# Patient Record
Sex: Male | Born: 1960 | Race: White | Hispanic: No | Marital: Single | Smoking: Never smoker
Health system: Southern US, Community
[De-identification: ages and names within clinical notes are randomized; demographics above are authoritative.]

## PROBLEM LIST (undated history)

## (undated) DIAGNOSIS — M75100 Unspecified rotator cuff tear or rupture of unspecified shoulder, not specified as traumatic: Secondary | ICD-10-CM

## (undated) DIAGNOSIS — Z789 Other specified health status: Secondary | ICD-10-CM

## (undated) DIAGNOSIS — M19012 Primary osteoarthritis, left shoulder: Secondary | ICD-10-CM

## (undated) DIAGNOSIS — K5792 Diverticulitis of intestine, part unspecified, without perforation or abscess without bleeding: Secondary | ICD-10-CM

## (undated) DIAGNOSIS — M7550 Bursitis of unspecified shoulder: Secondary | ICD-10-CM

## (undated) HISTORY — DX: Bursitis of unspecified shoulder: M75.50

## (undated) HISTORY — DX: Diverticulitis of intestine, part unspecified, without perforation or abscess without bleeding: K57.92

## (undated) HISTORY — DX: Unspecified rotator cuff tear or rupture of unspecified shoulder, not specified as traumatic: M75.100

---

## 2006-08-10 ENCOUNTER — Encounter: Admission: RE | Admit: 2006-08-10 | Discharge: 2006-08-10 | Payer: Self-pay | Admitting: Internal Medicine

## 2006-08-20 ENCOUNTER — Ambulatory Visit: Payer: Self-pay | Admitting: Gastroenterology

## 2006-08-27 ENCOUNTER — Ambulatory Visit (HOSPITAL_COMMUNITY): Admission: RE | Admit: 2006-08-27 | Discharge: 2006-08-27 | Payer: Self-pay | Admitting: Gastroenterology

## 2006-08-30 ENCOUNTER — Ambulatory Visit: Payer: Self-pay | Admitting: Gastroenterology

## 2009-01-19 ENCOUNTER — Ambulatory Visit: Payer: Self-pay | Admitting: Sports Medicine

## 2009-01-19 DIAGNOSIS — M67919 Unspecified disorder of synovium and tendon, unspecified shoulder: Secondary | ICD-10-CM | POA: Insufficient documentation

## 2009-01-19 DIAGNOSIS — M25519 Pain in unspecified shoulder: Secondary | ICD-10-CM

## 2009-01-19 DIAGNOSIS — M719 Bursopathy, unspecified: Secondary | ICD-10-CM

## 2009-01-26 ENCOUNTER — Ambulatory Visit: Payer: Self-pay | Admitting: Sports Medicine

## 2009-01-27 ENCOUNTER — Encounter: Admission: RE | Admit: 2009-01-27 | Discharge: 2009-03-25 | Payer: Self-pay | Admitting: Sports Medicine

## 2009-01-28 ENCOUNTER — Encounter: Payer: Self-pay | Admitting: Sports Medicine

## 2009-03-04 ENCOUNTER — Ambulatory Visit: Payer: Self-pay | Admitting: Sports Medicine

## 2009-03-05 ENCOUNTER — Encounter: Payer: Self-pay | Admitting: Sports Medicine

## 2009-04-26 HISTORY — PX: COLONOSCOPY: SHX174

## 2009-08-24 HISTORY — PX: OTHER SURGICAL HISTORY: SHX169

## 2009-10-04 ENCOUNTER — Ambulatory Visit: Payer: Self-pay | Admitting: Family Medicine

## 2009-10-04 DIAGNOSIS — J309 Allergic rhinitis, unspecified: Secondary | ICD-10-CM | POA: Insufficient documentation

## 2009-10-04 DIAGNOSIS — Z8719 Personal history of other diseases of the digestive system: Secondary | ICD-10-CM

## 2009-10-04 DIAGNOSIS — R1032 Left lower quadrant pain: Secondary | ICD-10-CM

## 2009-10-04 LAB — CONVERTED CEMR LAB
Blood in Urine, dipstick: NEGATIVE
Ketones, urine, test strip: NEGATIVE
Nitrite: NEGATIVE
Protein, U semiquant: NEGATIVE
Urobilinogen, UA: 0.2

## 2009-10-06 LAB — CONVERTED CEMR LAB
Anti Nuclear Antibody(ANA): NEGATIVE
Basophils Absolute: 0 10*3/uL (ref 0.0–0.1)
Basophils Relative: 0.3 % (ref 0.0–3.0)
CO2: 31 meq/L (ref 19–32)
Calcium: 9.3 mg/dL (ref 8.4–10.5)
Eosinophils Relative: 1.3 % (ref 0.0–5.0)
Glucose, Bld: 82 mg/dL (ref 70–99)
HCT: 42.7 % (ref 39.0–52.0)
Hemoglobin: 15 g/dL (ref 13.0–17.0)
Lymphocytes Relative: 27.6 % (ref 12.0–46.0)
MCHC: 35.2 g/dL (ref 30.0–36.0)
MCV: 91.6 fL (ref 78.0–100.0)
Monocytes Absolute: 0.3 10*3/uL (ref 0.1–1.0)
Monocytes Relative: 7.3 % (ref 3.0–12.0)
Neutro Abs: 2.9 10*3/uL (ref 1.4–7.7)
Neutrophils Relative %: 63.5 % (ref 43.0–77.0)
RDW: 13.2 % (ref 11.5–14.6)
Sodium: 143 meq/L (ref 135–145)
Total Protein: 7.5 g/dL (ref 6.0–8.3)
WBC: 4.6 10*3/uL (ref 4.5–10.5)

## 2009-10-26 ENCOUNTER — Ambulatory Visit: Payer: Self-pay | Admitting: Family Medicine

## 2009-10-26 DIAGNOSIS — M2550 Pain in unspecified joint: Secondary | ICD-10-CM | POA: Insufficient documentation

## 2009-10-26 DIAGNOSIS — I10 Essential (primary) hypertension: Secondary | ICD-10-CM | POA: Insufficient documentation

## 2009-11-04 ENCOUNTER — Encounter: Payer: Self-pay | Admitting: Family Medicine

## 2010-05-03 ENCOUNTER — Ambulatory Visit: Payer: Self-pay | Admitting: Family Medicine

## 2010-05-03 DIAGNOSIS — J209 Acute bronchitis, unspecified: Secondary | ICD-10-CM

## 2010-07-26 NOTE — Consult Note (Signed)
Summary: [facility]   Imported By: Maryln Gottron 11/23/2009 14:22:18  _____________________________________________________________________  External Attachment:    Type:   Image     Comment:   External Document

## 2010-07-26 NOTE — Assessment & Plan Note (Signed)
Summary: fup meds//ccm   Vital Signs:  Patient profile:   50 year old male Weight:      240 pounds BMI:     29.32 BP sitting:   140 / 102  (left arm) Cuff size:   regular  Vitals Entered By: Raechel Ache, RN (Oct 26, 2009 2:10 PM) CC: ROV- still having headaches, joint pain and feels light-headed.   History of Present Illness: Here to follow up symptoms including, diffuse joint pains and body aches, sweats, HAs, and LLQ abdominal which led him to see [place] 2 weeks ago. We gave him a course of Cipro and did a lab workup that was mostly unremarkable. This included a CBC, BMET, liver panel, TSH, ESR, ANA, and Lyme antibody titers. His RF was slightly elevated at 28. Since then the abdomenal pain has resolved, and the HAs have improved from throbbing to mild and dull intermittent pains. He still has the joint pains and generalized fatigue. No skin rashes. We have also been watching his elevated BP which is still up today.   Allergies (verified): No Known Drug Allergies  Past History:  Past Medical History: Reviewed history from 10/04/2009 and no changes required. Allergic rhinitis Diverticulitis, hx of right shoulder bursitis and rotator cuff syndrome, sees Dr. Enid Baas  Past Surgical History: Reviewed history from 10/04/2009 and no changes required. large sebaceous cyst removed from left cheek in 08-2009 per Dr. Aurelio Jew  Family History: Reviewed history from 10/04/2009 and no changes required. Family History Breast cancer 1st degree relative <50 Family History Hypertension Paget's disease- Dad  Review of Systems  The patient denies anorexia, fever, weight loss, weight gain, vision loss, decreased hearing, hoarseness, chest pain, syncope, dyspnea on exertion, peripheral edema, prolonged cough, hemoptysis, abdominal pain, melena, hematochezia, severe indigestion/heartburn, hematuria, incontinence, genital sores, muscle weakness, suspicious skin lesions, transient blindness,  difficulty walking, depression, unusual weight change, abnormal bleeding, enlarged lymph nodes, angioedema, breast masses, and testicular masses.    Physical Exam  General:  Well-developed,well-nourished,in no acute distress; alert,appropriate and cooperative throughout examination Head:  Normocephalic and atraumatic without obvious abnormalities. No apparent alopecia or balding. Eyes:  No corneal or conjunctival inflammation noted. EOMI. Perrla. Funduscopic exam benign, without hemorrhages, exudates or papilledema. Vision grossly normal. Ears:  External ear exam shows no significant lesions or deformities.  Otoscopic examination reveals clear canals, tympanic membranes are intact bilaterally without bulging, retraction, inflammation or discharge. Hearing is grossly normal bilaterally. Nose:  External nasal examination shows no deformity or inflammation. Nasal mucosa are pink and moist without lesions or exudates. Mouth:  Oral mucosa and oropharynx without lesions or exudates.  Teeth in good repair. Neck:  No deformities, masses, or tenderness noted. Lungs:  Normal respiratory effort, chest expands symmetrically. Lungs are clear to auscultation, no crackles or wheezes. Heart:  Normal rate and regular rhythm. S1 and S2 normal without gallop, murmur, click, rub or other extra sounds. Abdomen:  Bowel sounds positive,abdomen soft and non-tender without masses, organomegaly or hernias noted. Msk:  No deformity or scoliosis noted of thoracic or lumbar spine.  no joint tenderness, no joint swelling, and no joint warmth.   Extremities:  no edema Neurologic:  alert & oriented X3, cranial nerves II-XII intact, and gait normal.   Skin:  Intact without suspicious lesions or rashes   Impression & Recommendations:  Problem # 1:  ARTHRALGIA (ICD-719.40)  Orders: Rheumatology Referral (Rheumatology)  Problem # 2:  ESSENTIAL HYPERTENSION (ICD-401.9)  His updated medication list for this problem  includes:    Hydrochlorothiazide 25 Mg Tabs (Hydrochlorothiazide) ..... Once daily  Complete Medication List: 1)  Hydrocodone-acetaminophen 5-500 Mg Tabs (Hydrocodone-acetaminophen) .Marland Kitchen.. 1 by mouth qid prin 2)  Hydrochlorothiazide 25 Mg Tabs (Hydrochlorothiazide) .... Once daily  Patient Instructions: 1)  I am not sure how to explain all his symptoms, although now they seem to be more of a rheumatologic nature than infectious. we will refer him to Rheumatology to help [place] evaluate this. Start treating the HTN with some HCTZ.  Prescriptions: HYDROCHLOROTHIAZIDE 25 MG TABS (HYDROCHLOROTHIAZIDE) once daily  #30 x 5   Entered and Authorized by:   Nelwyn Salisbury MD   Signed by:   Nelwyn Salisbury MD on 10/26/2009   Method used:   Electronically to        [facility]  [phone]* (retail)       [address] [place], [place]  [address]       Ph: [phone] or [phone]       Fax: [phone]   RxID:   [id number]

## 2010-07-26 NOTE — Assessment & Plan Note (Signed)
Summary: SINUSITIS? // RS   Vital Signs:  Patient profile:   50 year old male Weight:      250 pounds O2 Sat:      94 % Temp:     98.6 degrees F Pulse rate:   92 / minute BP sitting:   120 / 80  (left arm) Cuff size:   large  Vitals Entered By: Pura Spice, RN (May 03, 2010 1:36 PM) CC: sinus inf .   History of Present Illness: Here for one week of sinus pressure, HA, ST, chest congestion, and coughing up green sputum. No fever. he is still having intermittent body aches and low grade fevers. We tested for Lyme antibodies along with a number of other things, all of which were negative. He asks about a more accurate test to look for this, such as a Western blot.   Allergies (verified): No Known Drug Allergies  Past History:  Past Medical History: Reviewed history from 10/04/2009 and no changes required. Allergic rhinitis Diverticulitis, hx of right shoulder bursitis and rotator cuff syndrome, sees Dr. Enid Baas  Past Surgical History: Reviewed history from 10/04/2009 and no changes required. large sebaceous cyst removed from left cheek in 08-2009 per Dr. Aurelio Jew  Review of Systems  The patient denies anorexia, weight loss, weight gain, vision loss, decreased hearing, hoarseness, chest pain, syncope, dyspnea on exertion, peripheral edema, hemoptysis, abdominal pain, melena, hematochezia, severe indigestion/heartburn, hematuria, incontinence, genital sores, muscle weakness, suspicious skin lesions, transient blindness, difficulty walking, depression, unusual weight change, abnormal bleeding, enlarged lymph nodes, angioedema, breast masses, and testicular masses.    Physical Exam  General:  Well-developed,well-nourished,in no acute distress; alert,appropriate and cooperative throughout examination Head:  Normocephalic and atraumatic without obvious abnormalities. No apparent alopecia or balding. Eyes:  No corneal or conjunctival inflammation noted. EOMI. Perrla.  Funduscopic exam benign, without hemorrhages, exudates or papilledema. Vision grossly normal. Ears:  External ear exam shows no significant lesions or deformities.  Otoscopic examination reveals clear canals, tympanic membranes are intact bilaterally without bulging, retraction, inflammation or discharge. Hearing is grossly normal bilaterally. Nose:  External nasal examination shows no deformity or inflammation. Nasal mucosa are pink and moist without lesions or exudates. Mouth:  Oral mucosa and oropharynx without lesions or exudates.  Teeth in good repair. Neck:  No deformities, masses, or tenderness noted. Lungs:  scattered rhonchi   Impression & Recommendations:  Problem # 1:  ACUTE BRONCHITIS (ICD-466.0)  His updated medication list for this problem includes:    Augmentin 875-125 Mg Tabs (Amoxicillin-pot clavulanate) .Marland Kitchen..Marland Kitchen Two times a day  Problem # 2:  ARTHRALGIA (ICD-719.40)  Orders: T- * Misc. Laboratory test [phone])  Complete Medication List: 1)  Augmentin 875-125 Mg Tabs (Amoxicillin-pot clavulanate) .... Two times a day  Patient Instructions: 1)  Please schedule a follow-up appointment as needed . we will check a Lyme PCR today.  Prescriptions: AUGMENTIN 875-125 MG TABS (AMOXICILLIN-POT CLAVULANATE) two times a day  #20 x 0   Entered and Authorized by:   Nelwyn Salisbury MD   Signed by:   Nelwyn Salisbury MD on 05/03/2010   Method used:   Electronically to        [facility]  [phone]* (retail)       [address] [place], [place]  [address]       Ph: [phone] or [phone]       Fax: [phone]   RxID:   [id number]  Orders Added: 1)  T- * Misc. Laboratory test [99999] 2)  Est. Patient Level IV [[address]]

## 2010-07-26 NOTE — Assessment & Plan Note (Signed)
Summary: BRAND NEW PT/TO EST/HAVING ISSUES W/ MIGRAINES AND JOINT ISSU...   Vital Signs:  Patient profile:   50 year old male Weight:      243 pounds BMI:     29.69 Temp:     98.0 degrees F oral BP sitting:   158 / 110  (left arm) Cuff size:   regular  Vitals Entered By: Raechel Ache, RN (October 04, 2009 11:12 AM) CC: Went to Urgent Care in Feb with "migraine" and swollen L face. Saw plastic surgeon to have cyst removed from L cheek. Still c/o headaches and night sweats and joints hurt. Here for follow-up.   History of Present Illness: 50 yr old male to establish with [place] who is complaining of 2 weeks of various symptoms including LLQ abdominal pains. These are well localized, sharp, and intermittent. Not very severe. Not related to eating. His BMs are regular, and his urinations are regular. No nausea, appetite is normal. Advil helps a little. He had a bout of diverticulitis in 2008, and this reminds him of what that felt like. He had a colonoscopy in 2008, but I do not have this report available. Along with this he has had diffuse joint aches including shoulders, back, hips, and knees. No swelling or rashes are visible. He has not had a fever, but he describes night sweats on most nights. No cough or SOB. He also has mild generalized HAs at times. He has been told his BP was "borderline high" in the past, but he has never been treated for this.   Preventive Screening-Counseling & Management  Alcohol-Tobacco     Smoking Status: never  Caffeine-Diet-Exercise     Does Patient Exercise: no      Drug Use:  no.    Allergies (verified): No Known Drug Allergies  Past History:  Past Medical History: Allergic rhinitis Diverticulitis, hx of right shoulder bursitis and rotator cuff syndrome, sees Dr. Enid Baas  Past Surgical History: large sebaceous cyst removed from left cheek in 08-2009 per Dr. Aurelio Jew  Family History: Reviewed history and no changes required. Family History  Breast cancer 1st degree relative <50 Family History Hypertension Paget's disease- Dad  Social History: Reviewed history and no changes required. Occupation:  business Scientist, physiological Married Never Smoked Alcohol use-yes Drug use-no Regular exercise-no Occupation:  employed Smoking Status:  never Drug Use:  no Does Patient Exercise:  no  Review of Systems  The patient denies anorexia, fever, weight loss, weight gain, vision loss, decreased hearing, hoarseness, chest pain, syncope, dyspnea on exertion, peripheral edema, prolonged cough, headaches, hemoptysis, melena, hematochezia, severe indigestion/heartburn, hematuria, incontinence, genital sores, muscle weakness, suspicious skin lesions, transient blindness, difficulty walking, depression, unusual weight change, abnormal bleeding, enlarged lymph nodes, angioedema, breast masses, and testicular masses.    Physical Exam  General:  Well-developed,well-nourished,in no acute distress; alert,appropriate and cooperative throughout examination Head:  Normocephalic and atraumatic without obvious abnormalities. No apparent alopecia or balding. Eyes:  No corneal or conjunctival inflammation noted. EOMI. Perrla. Funduscopic exam benign, without hemorrhages, exudates or papilledema. Vision grossly normal. Ears:  External ear exam shows no significant lesions or deformities.  Otoscopic examination reveals clear canals, tympanic membranes are intact bilaterally without bulging, retraction, inflammation or discharge. Hearing is grossly normal bilaterally. Nose:  External nasal examination shows no deformity or inflammation. Nasal mucosa are pink and moist without lesions or exudates. Mouth:  Oral mucosa and oropharynx without lesions or exudates.  Teeth in good repair. Neck:  No deformities, masses, or  tenderness noted. Lungs:  Normal respiratory effort, chest expands symmetrically. Lungs are clear to auscultation, no crackles or wheezes. Heart:   Normal rate and regular rhythm. S1 and S2 normal without gallop, murmur, click, rub or other extra sounds. Abdomen:  soft, normal bowel sounds, no distention, no masses, no guarding, no rigidity, no rebound tenderness, no abdominal hernia, no inguinal hernia, no hepatomegaly, and no splenomegaly.  Mildly tender in the LLQ.  Msk:  No deformity or scoliosis noted of thoracic or lumbar spine.   Extremities:  no edema. No joint tenderness Skin:  Intact without suspicious lesions or rashes Cervical Nodes:  No lymphadenopathy noted Axillary Nodes:  No palpable lymphadenopathy Inguinal Nodes:  No significant adenopathy   Impression & Recommendations:  Problem # 1:  ABDOMINAL PAIN, LEFT LOWER QUADRANT (ICD-789.04)  Orders: Venipuncture ([address]38) TLB-BMP (Basic Metabolic Panel-BMET) (80048-METABOL) TLB-CBC Platelet - w/Differential (85025-CBCD) TLB-Hepatic/Liver Function Pnl (80076-HEPATIC) TLB-TSH (Thyroid Stimulating Hormone) (84443-TSH) TLB-Sedimentation Rate (ESR) (85652-ESR) TLB-Rheumatoid Factor (RA) (75643-PI) T-Antinuclear Antib (ANA) (95188-41660) T-Lyme Disease (63016-01093) UA Dipstick w/o Micro (manual) (23557)  Complete Medication List: 1)  Hydrocodone-acetaminophen 5-500 Mg Tabs (Hydrocodone-acetaminophen) .Marland Kitchen.. 1 by mouth qid prin 2)  Ciprofloxacin Hcl 500 Mg Tabs (Ciprofloxacin hcl) .... Two times a day  Patient Instructions: 1)  His abdominal symptoms are most consistent with diverticulitis, so we will begin treating with Cipro. His other symptoms could be coming from this also, but I wonder if his HAs are related to high BP. Get labs today. Drink fluids, use Advil as needed . We will follow his BP closely.  Prescriptions: CIPROFLOXACIN HCL 500 MG TABS (CIPROFLOXACIN HCL) two times a day  #20 x 0   Entered and Authorized by:   Nelwyn Salisbury MD   Signed by:   Nelwyn Salisbury MD on 10/04/2009   Method used:   Elect[facility] to        [facility]  [phone]* (retail)        [address] [place], [place]  [address]       Ph: [phone] or [phone]       Fax: [phone]   RxID:   [id number]    Preventive Care Screening  Colonoscopy:    Date:  04/26/2009    Results:  normal       Laboratory Results   Urine Tests    Routine Urinalysis   Color: yellow Appearance: Clear Glucose: negative   (Normal Range: Negative) Bilirubin: negative   (Normal Range: Negative) Ketone: negative   (Normal Range: Negative) Spec. Gravity: 1.010   (Normal Range: 1.003-1.035) Blood: negative   (Normal Range: Negative) pH: 5.0   (Normal Range: 5.0-8.0) Protein: negative   (Normal Range: Negative) Urobilinogen: 0.2   (Normal Range: 0-1) Nitrite: negative   (Normal Range: Negative) Leukocyte Esterace: negative   (Normal Range: Negative)

## 2010-11-05 ENCOUNTER — Emergency Department (HOSPITAL_COMMUNITY): Payer: BC Managed Care – PPO

## 2010-11-05 ENCOUNTER — Emergency Department (HOSPITAL_COMMUNITY)
Admission: EM | Admit: 2010-11-05 | Discharge: 2010-11-05 | Disposition: A | Discharge status: Home / Self Care | Payer: BC Managed Care – PPO | Attending: Emergency Medicine | Admitting: Emergency Medicine

## 2010-11-05 DIAGNOSIS — M2550 Pain in unspecified joint: Secondary | ICD-10-CM | POA: Insufficient documentation

## 2010-11-05 DIAGNOSIS — G43909 Migraine, unspecified, not intractable, without status migrainosus: Secondary | ICD-10-CM | POA: Insufficient documentation

## 2010-11-05 DIAGNOSIS — IMO0001 Reserved for inherently not codable concepts without codable children: Secondary | ICD-10-CM | POA: Insufficient documentation

## 2010-11-05 DIAGNOSIS — R112 Nausea with vomiting, unspecified: Secondary | ICD-10-CM | POA: Insufficient documentation

## 2010-11-05 DIAGNOSIS — M542 Cervicalgia: Secondary | ICD-10-CM | POA: Insufficient documentation

## 2010-11-05 DIAGNOSIS — H53149 Visual discomfort, unspecified: Secondary | ICD-10-CM | POA: Insufficient documentation

## 2010-11-05 LAB — DIFFERENTIAL
Basophils Relative: 0 % (ref 0–1)
Lymphocytes Relative: 19 % (ref 12–46)
Lymphs Abs: 0.5 10*3/uL — ABNORMAL LOW (ref 0.7–4.0)
Monocytes Relative: 9 % (ref 3–12)
Neutrophils Relative %: 72 % (ref 43–77)

## 2010-11-05 LAB — CBC
MCH: 31.3 pg (ref 26.0–34.0)
MCV: 86.4 fL (ref 78.0–100.0)
Platelets: 157 10*3/uL (ref 150–400)
RBC: 5.08 MIL/uL (ref 4.22–5.81)

## 2010-11-05 LAB — COMPREHENSIVE METABOLIC PANEL
ALT: 40 U/L (ref 0–53)
AST: 40 U/L — ABNORMAL HIGH (ref 0–37)
CO2: 29 mEq/L (ref 19–32)
Calcium: 9.4 mg/dL (ref 8.4–10.5)
Chloride: 103 mEq/L (ref 96–112)
Creatinine, Ser: 0.97 mg/dL (ref 0.4–1.5)
Glucose, Bld: 113 mg/dL — ABNORMAL HIGH (ref 70–99)
Potassium: 4.1 mEq/L (ref 3.5–5.1)

## 2010-11-05 LAB — URINALYSIS, ROUTINE W REFLEX MICROSCOPIC
Bilirubin Urine: NEGATIVE
Glucose, UA: NEGATIVE mg/dL
Ketones, ur: NEGATIVE mg/dL
Leukocytes, UA: NEGATIVE
Protein, ur: NEGATIVE mg/dL
Specific Gravity, Urine: 1.025 (ref 1.005–1.030)
Urobilinogen, UA: 1 mg/dL (ref 0.0–1.0)

## 2010-11-05 LAB — SEDIMENTATION RATE: Sed Rate: 4 mm/hr (ref 0–16)

## 2010-11-07 ENCOUNTER — Telehealth: Payer: Self-pay | Admitting: *Deleted

## 2010-11-07 NOTE — Telephone Encounter (Signed)
Pt in ER Saturday night, and needs return appt.  Appt given

## 2010-11-11 ENCOUNTER — Ambulatory Visit (INDEPENDENT_AMBULATORY_CARE_PROVIDER_SITE_OTHER): Payer: BC Managed Care – PPO | Admitting: Family Medicine

## 2010-11-11 ENCOUNTER — Encounter: Payer: Self-pay | Admitting: Family Medicine

## 2010-11-11 VITALS — BP 112/84 | HR 78 | Temp 98.5°F | Wt 243.0 lb

## 2010-11-11 DIAGNOSIS — M2550 Pain in unspecified joint: Secondary | ICD-10-CM

## 2010-11-11 DIAGNOSIS — R51 Headache: Secondary | ICD-10-CM

## 2010-11-11 DIAGNOSIS — R61 Generalized hyperhidrosis: Secondary | ICD-10-CM

## 2010-11-11 LAB — T4, FREE: Free T4: 1.09 ng/dL (ref 0.60–1.60)

## 2010-11-11 LAB — TSH: TSH: 1.69 u[IU]/mL (ref 0.35–5.50)

## 2010-11-11 LAB — VITAMIN B12: Vitamin B-12: 756 pg/mL (ref 211–911)

## 2010-11-11 NOTE — Letter (Signed)
August 20, 2006     RE:  STEPHENS, SHREVE  MRN:  [id number]  /  DOB:  01-16-1961   Dear Mr. Hilligoss:   It is my pleasure to have treated you recently as a new patient in my  office.  I appreciate your confidence and the opportunity to participate  in your care.   Since I do have a busy inpatient endoscopy schedule and office schedule,  my office hours vary weekly.  I am, however, available for emergency  calls every day through my office.  If I cannot promptly meet an urgent  office appointment, another one of our gastroenterologists will be able  to assist you.   My well-trained staff are prepared to help you at all times.  For  emergencies after office hours, a physician from our gastroenterology  section is always available through my 24-hour answering service.   While you are under my care, I encourage discussion of your questions  and concerns, and I will be happy to return your calls as soon as I am  available.   Once again, I welcome you as a new patient and I look forward to a happy  and healthy relationship.    Sincerely,      Barbette Hair. Arlyce Dice, MD,FACG  Electronically Signed    RDK/MedQ  DD: 08/20/2006  DT: 08/20/2006  Job #: [id number]

## 2010-11-11 NOTE — Letter (Signed)
August 20, 2006    Kari Baars, M.D.  [address]  [place], [place] [address]   RE:  HERLEY, BERNARDINI  MRN:  [id number]  /  DOB:  08-14-1960   Dear Dr. Clelia Croft:   Upon your kind referral, I had the pleasure of evaluating your patient  and I am pleased to offer my findings.  I saw Mr.Mike Watson in the  office today.  Enclosed is a copy of my progress note that details my  findings and recommendations.   Thank you for the opportunity to participate in your patient's care.    Sincerely,      Barbette Hair. Arlyce Dice, MD,FACG  Electronically Signed    RDK/MedQ  DD: 08/20/2006  DT: 08/20/2006  Job #: [id number]

## 2010-11-11 NOTE — Assessment & Plan Note (Signed)
[facility]                         GASTROENTEROLOGY OFFICE NOTE   Mike, Watson                         MRN:          [id number]  DATE:08/20/2006                            DOB:          Oct 28, 1960    REASON FOR CONSULTATION:  Abdominal pain.   [name] is a pleasant 50 year old white male referred through  the courtesy of Dr. Clelia Croft for evaluation.  Over the last 4 months, he has  noted progressive left lower quadrant pain.  It is described as a sharp  pain followed by aching.  His symptoms have been progressive.  He has  also noted a change in bowel habits consisting of up to 7-8 loose stools  a day.  There is no history of melena or hematochezia.  Recent CBC and  CT scan were unremarkable.  He underwent a screening colonoscopy in 2002  that apparently was normal.  Recent hemoccult was also negative.  Past  medical history is pertinent for hypertension.  Family history is  noncontributory.   MEDICATIONS:  He is on no regular medications.  He has no allergies.   He neither smokes nor drinks.  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  Review of systems is positive for urinary frequency with some urgency  increasing nocturia and back pain.   PHYSICAL EXAMINATION:  GENERAL:  He is a healthy-appearing male.  VITAL SIGNS:  Pulse 84, blood pressure 112/84, weight 246.  HEENT: EOMI. PERRLA. Sclerae are anicteric.  Conjunctivae are pink.  NECK:  Supple without thyromegaly, adenopathy or carotid bruits.  CHEST:  Clear to auscultation and percussion without adventitious  sounds.  CARDIAC:  Regular rhythm; normal S1 S2.  There are no murmurs, gallops  or rubs.  ABDOMEN:  He has minimal left lower quadrant tenderness to deep  palpation without guarding or rebound.  Bowel sounds are normoactive.  Abdomen is soft, There are no abdominal masses, organomegaly, splenic  enlargement or hepatomegaly.  EXTREMITIES:  Full range of motion.  No cyanosis,  clubbing or edema.  RECTAL:  Deferred.   IMPRESSION:  1. Progressive left lower quadrant pain with change in bowel habits.      A structure lesion in the colon is of concern increasing      diverticulosis and neoplasm.  2. Polyuria.  I suspect he has enlarged prostate versus bladder      compression from perhaps a colonic lesion.   RECOMMENDATION:  Colonoscopy.  Urologic workup per Dr. Clelia Croft.     Barbette Hair. Arlyce Dice, MD,FACG  Electronically Signed    RDK/MedQ  DD: 08/20/2006  DT: 08/20/2006  Job #: [id number]   cc:   Kari Baars, M.D.

## 2010-11-11 NOTE — Progress Notes (Signed)
  Subjective:    Patient ID: Mike Watson, male    DOB: December 10, 1960, 50 y.o.   MRN: [id number]  HPI Here to follow up on an unusual set of symptoms that started about 10 days ago. These symptoms are almost identical to those he had about one year ago. We worked these up at that time with no etiology ever being found. We did a CT of his head, along with many labs that were all normal. We even checked a Western Blot for Lyme disease, which was negative. He saw Dr. Azzie Roup for a rheumatologic workup, including a UPEP and SPEP, and again no etilogy was found. After several months these symptoms went away, and he did well for quite a while until recently. Now he again is having daily episodes of severe bilateral frontal HAs with sweats and diffuse joint pains. No swelling or rashes. No vision changes. No nausea or vomiting. No measurable fevers. No neck pains. These spells usually come at night, and he has to change his bedsheets once or twice a night due to the sweats. He takes Motrin for the HAs and joint pains, with little relief. He went to the ER on 11-05-10 and had a full workup with labs and another head CT, all of which was normal. He was told to follow up with [place]. His appetite is normal, although he has lost about 6 lbs in the past few months.    Review of Systems  Constitutional: Positive for diaphoresis and unexpected weight change. Negative for fever, chills, activity change, appetite change and fatigue.  Eyes: Negative.   Respiratory: Negative.   Cardiovascular: Negative.   Gastrointestinal: Negative.   Genitourinary: Negative.   Musculoskeletal: Positive for arthralgias. Negative for myalgias, back pain and joint swelling.  Skin: Negative.   Neurological: Positive for headaches. Negative for dizziness, tremors, seizures, syncope, facial asymmetry, speech difficulty, weakness, light-headedness and numbness.  Hematological: Negative.   Psychiatric/Behavioral: Negative.          Objective:   Physical Exam  Constitutional: He is oriented to person, place, and time. He appears well-developed and well-nourished.  HENT:  Head: Normocephalic and atraumatic.  Eyes: Conjunctivae and EOM are normal. Pupils are equal, round, and reactive to light.  Neck: Normal range of motion. Neck supple. No thyromegaly present.  Cardiovascular: Normal rate, regular rhythm, normal heart sounds and intact distal pulses.   Pulmonary/Chest: Effort normal and breath sounds normal.  Abdominal: Soft. Bowel sounds are normal. He exhibits no distension and no mass. There is no tenderness. There is no rebound and no guarding.  Musculoskeletal: Normal range of motion. He exhibits no edema and no tenderness.  Lymphadenopathy:    He has no cervical adenopathy.  Neurological: He is oriented to person, place, and time. He has normal reflexes. No cranial nerve deficit. He exhibits normal muscle tone. Coordination normal.  Skin: Skin is warm and dry. No rash noted. No erythema.          Assessment & Plan:  Headaches and sweats and arthralgias of unknown etiology. These could represent a form of migraines, I suppose, but this would be very unusual. I do not think he has any sort of infection. We will check a full thyroid panel today and check urine metanephrines. Will refer to Neurology also.

## 2010-11-14 ENCOUNTER — Telehealth: Payer: Self-pay

## 2010-11-14 ENCOUNTER — Telehealth: Payer: Self-pay | Admitting: Family Medicine

## 2010-11-14 MED ORDER — OXYCODONE-ACETAMINOPHEN 10-325 MG PO TABS: 1.0000 | ORAL_TABLET | Freq: Four times a day (QID) | ORAL | Status: AC | PRN

## 2010-11-14 NOTE — Progress Notes (Signed)
Quick Note:  Pt is aware. ______ 

## 2010-11-14 NOTE — Telephone Encounter (Signed)
No it is nothing to worry about, but if it drops below 350 I usually suggested getting on a replacement medication. Lets check it again in one year

## 2010-11-14 NOTE — Telephone Encounter (Signed)
Pt stated he would like to know that since his Testerone was on the lower end of the range is that something he needs to be concerned about?

## 2010-11-14 NOTE — Progress Notes (Signed)
Quick Note:  Pt is awre ______

## 2010-11-14 NOTE — Telephone Encounter (Signed)
done

## 2010-11-14 NOTE — Telephone Encounter (Signed)
Pt is aware.  

## 2010-11-17 ENCOUNTER — Telehealth: Payer: Self-pay | Admitting: Family Medicine

## 2010-11-17 NOTE — Telephone Encounter (Signed)
Pt would like blood work results °

## 2010-11-18 ENCOUNTER — Telehealth: Payer: Self-pay | Admitting: Family Medicine

## 2010-11-18 NOTE — Telephone Encounter (Signed)
LMTCB

## 2010-11-18 NOTE — Telephone Encounter (Signed)
Pt calling again with test results of 24 hour urine that was dropped off on Monday

## 2010-11-18 NOTE — Telephone Encounter (Signed)
See report

## 2010-11-18 NOTE — Telephone Encounter (Signed)
Please call him. I dont understand what he needs. Mike Watson talked to him 3 days ago about the results

## 2010-11-18 NOTE — Telephone Encounter (Signed)
Requesting lab results

## 2011-02-10 ENCOUNTER — Telehealth: Payer: Self-pay | Admitting: Family Medicine

## 2011-02-10 DIAGNOSIS — M2550 Pain in unspecified joint: Secondary | ICD-10-CM

## 2011-02-10 NOTE — Telephone Encounter (Signed)
I ordered this referral. Mike Watson will be calling with appt info

## 2011-02-10 NOTE — Telephone Encounter (Signed)
Pt called and is needing a referral to [facility] at [facility] # [phone] and their fax # [phone]. Pts joint pain and headaches have worsened over the last yr. Pt req the first appt avail at referral.

## 2011-02-13 NOTE — Telephone Encounter (Signed)
Spoke with pt and he has his appointment for the referral.

## 2011-06-02 ENCOUNTER — Telehealth: Payer: Self-pay

## 2011-06-02 NOTE — Telephone Encounter (Signed)
Pt called and states he was referred to [facility] and our office should have been contacted for 2 test [facility] would like him to get through Dr. Clent Ridges.  Pt would like to know if the fax was received.

## 2011-06-05 NOTE — Telephone Encounter (Signed)
Pt called again to check of status of orders requested via fax from [facility]. Please return call to pt on his cell

## 2011-06-06 NOTE — Telephone Encounter (Signed)
Left a message for pt to return call 

## 2011-06-06 NOTE — Telephone Encounter (Signed)
Pt is aware.  

## 2011-06-06 NOTE — Telephone Encounter (Signed)
I cannot do this. I cannot order a test in my name that I have not even heard of. The [facility] doctor needs to write an order for the patient to go to [facility] outpatient lab to have this drawn

## 2012-03-22 ENCOUNTER — Ambulatory Visit
Admission: RE | Admit: 2012-03-22 | Discharge: 2012-03-22 | Disposition: A | Discharge status: Home / Self Care | Payer: BC Managed Care – PPO | Source: Ambulatory Visit | Attending: Family Medicine | Admitting: Family Medicine

## 2012-03-22 ENCOUNTER — Encounter: Payer: Self-pay | Admitting: Family Medicine

## 2012-03-22 ENCOUNTER — Ambulatory Visit (INDEPENDENT_AMBULATORY_CARE_PROVIDER_SITE_OTHER): Payer: BC Managed Care – PPO | Admitting: Family Medicine

## 2012-03-22 VITALS — BP 143/98 | HR 69 | Ht 76.5 in | Wt 240.0 lb

## 2012-03-22 DIAGNOSIS — S43439A Superior glenoid labrum lesion of unspecified shoulder, initial encounter: Secondary | ICD-10-CM | POA: Insufficient documentation

## 2012-03-22 DIAGNOSIS — S46819A Strain of other muscles, fascia and tendons at shoulder and upper arm level, unspecified arm, initial encounter: Secondary | ICD-10-CM

## 2012-03-22 DIAGNOSIS — M25519 Pain in unspecified shoulder: Secondary | ICD-10-CM

## 2012-03-22 DIAGNOSIS — M25512 Pain in left shoulder: Secondary | ICD-10-CM

## 2012-03-22 MED ORDER — TRAMADOL HCL 50 MG PO TABS: 50.0000 mg | ORAL_TABLET | Freq: Three times a day (TID) | ORAL | Status: DC | PRN

## 2012-03-22 NOTE — Assessment & Plan Note (Signed)
Patient's clinical exam is concerning for labral pathology.  Procedure note After verbal and written consent given pt was prepped with betadine.  1:3 kenalog 40 to lidocaine used in left subacromial space.  Pt minimal bleeding dressed with band aid, Pt given red flags to look for pt had better pain control immediatly.    Patient did have an injection today to try to help with pain control. Patient also given a prescription for tramadol. Patient given home exercise program to start in 48-72 hours. Do to the significant weakness as well as clinical exam and concern for labral pathology we will get an MR arthrogram of the left shoulder after x-rays. Patient is a very active individual and has been getting worse over the course of last 3 weeks even with the addition of anti-inflammatories. Patient will followup after MRI arthrogram and we'll discuss options at that time.

## 2012-03-22 NOTE — Progress Notes (Signed)
Chief complaint: Left shoulder pain  History of present illness: Patient is a 51 year old male coming in with 3 weeks history of left shoulder pain. Patient has been working out at his gym more but does not remember any true injury. Patient states that he has been trying 800 mg of ibuprofen 3 times daily as well as icing multiple times a day without any benefit. Patient states that the pain continues to worsen. Patient states that it feels that it is his entire shoulder but more pain on the posterior aspect with some radiation down the arm. Patient also feels that he has some weakness in that arm. Patient denies any numbness, discoloration or swelling of the upper extremity. Patient has not injured the shoulder before. Patient has had the shoulder now wake him up at night for the last 3 nights and that is what the family decided to have him come in.  Past Medical History  Diagnosis Date  . Allergic rhinitis   . Diverticulitis   . Shoulder bursitis   . Rotator cuff syndrome     sees Dr. Enid Baas   Past Surgical History  Procedure Date  . Large sebaceous cyst removed from left cheek 08/2009    per Dr. Aurelio Jew   Family History  Problem Relation Age of Onset  . Paget's disease of bone Father   . Cancer Other     breast in 1st degree relative  . Hypertension Other    History   Social History  . Marital Status: Married    Spouse Name: N/A    Number of Children: N/A  . Years of Education: N/A   Occupational History  . Not on file.   Social History Main Topics  . Smoking status: Never Smoker   . Smokeless tobacco: Never Used  . Alcohol Use: Not on file  . Drug Use: Not on file  . Sexually Active: Not on file   Other Topics Concern  . Not on file   Social History Narrative  . No narrative on file   Physical exam General: No apparent distress alert and oriented x3 mood and affect is normal Respiratory: Patient's patient full senses and does not appear short of breath Skin:  Patient has no rash or any signs of infection Neuro: Cranial nerves II through XII are intact, neurovascularly intact in all extremities, deep tendon reflexes are 2+ and symmetric Shoulder:left Inspection reveals no abnormalities, atrophy or asymmetry. Palpation is normal with no tenderness over AC joint or bicipital groove. ROM is restricted in internal rotation and external rotation secondary to pain but does have full ROM passively. Rotator cuff strength good except weak with external rotation + Neer and Hawkin's tests, empty can. Speeds and Yergason's tests normal. ++ labral pathology noted with + Obrien's, negative clunk and good stability. Significant pain when compressing humeral head into posterior labrum. Normal scapular function observed. No painful arc and no drop arm sign. + apprehension sign  Ultrasound was performed and interpreted by me today. Patient did have some mild hypoechoic changes surrounding the subscapularis tendon but no true tear appreciated. Supraspinatus, infraspinatus and teres minor all visualized with no abnormalities. Patient's posterior labrum that was visualized showed no significant changes either.

## 2012-03-25 MED ORDER — HYDROCODONE-ACETAMINOPHEN 5-325 MG PO TABS: 2.0000 | ORAL_TABLET | Freq: Three times a day (TID) | ORAL | Status: DC | PRN

## 2012-03-25 NOTE — Progress Notes (Signed)
Pt called stating injection has not helped and tramadol is not helping either.  Unable to sleep well.  Per Dr. Jennette Kettle called in vicodin- moved MR arthrogram to tomorrow instead of next Monday.

## 2012-03-26 ENCOUNTER — Telehealth: Payer: Self-pay | Admitting: Family Medicine

## 2012-03-26 ENCOUNTER — Ambulatory Visit (HOSPITAL_COMMUNITY)
Admission: RE | Admit: 2012-03-26 | Discharge: 2012-03-26 | Disposition: A | Discharge status: Home / Self Care | Payer: BC Managed Care – PPO | Source: Ambulatory Visit | Attending: Family Medicine | Admitting: Family Medicine

## 2012-03-26 ENCOUNTER — Encounter: Payer: Self-pay | Admitting: Family Medicine

## 2012-03-26 DIAGNOSIS — M75100 Unspecified rotator cuff tear or rupture of unspecified shoulder, not specified as traumatic: Secondary | ICD-10-CM | POA: Insufficient documentation

## 2012-03-26 DIAGNOSIS — M67919 Unspecified disorder of synovium and tendon, unspecified shoulder: Secondary | ICD-10-CM | POA: Insufficient documentation

## 2012-03-26 DIAGNOSIS — M19019 Primary osteoarthritis, unspecified shoulder: Secondary | ICD-10-CM | POA: Insufficient documentation

## 2012-03-26 DIAGNOSIS — M719 Bursopathy, unspecified: Secondary | ICD-10-CM | POA: Insufficient documentation

## 2012-03-26 DIAGNOSIS — M25512 Pain in left shoulder: Secondary | ICD-10-CM

## 2012-03-26 DIAGNOSIS — IMO0002 Reserved for concepts with insufficient information to code with codable children: Secondary | ICD-10-CM | POA: Insufficient documentation

## 2012-03-26 DIAGNOSIS — M24119 Other articular cartilage disorders, unspecified shoulder: Secondary | ICD-10-CM | POA: Insufficient documentation

## 2012-03-26 DIAGNOSIS — M25519 Pain in unspecified shoulder: Secondary | ICD-10-CM | POA: Insufficient documentation

## 2012-03-26 MED ORDER — GADOBENATE DIMEGLUMINE 529 MG/ML IV SOLN
5.0000 mL | Freq: Once | INTRAVENOUS | Status: AC | PRN
  Administered 2012-03-26: 5 mL via INTRAVENOUS

## 2012-03-26 MED ORDER — IOHEXOL 300 MG/ML  SOLN
50.0000 mL | Freq: Once | INTRAMUSCULAR | Status: AC | PRN
  Administered 2012-03-26: 10 mL via INTRA_ARTICULAR

## 2012-03-26 NOTE — Telephone Encounter (Signed)
Amy Tell him he DOES have a labral tear--I recommend he see an orthopedist.I would send him to [facility] [place] or Reita Cliche at [facility] unless he has a specific preference THANKS! Denny Levy I would also give him a refill on the vicodin if it helped [facility]! Denny Levy

## 2012-03-27 NOTE — Telephone Encounter (Signed)
Scheduled pt for appointment with Dr. Dion Saucier 03/28/12 at 3pm for shoulder eval.

## 2012-04-01 ENCOUNTER — Other Ambulatory Visit: Payer: BC Managed Care – PPO

## 2012-04-30 ENCOUNTER — Encounter (HOSPITAL_BASED_OUTPATIENT_CLINIC_OR_DEPARTMENT_OTHER): Payer: Self-pay | Admitting: *Deleted

## 2012-04-30 NOTE — Progress Notes (Signed)
No cardiac or resp problems No labs needed-never smoked

## 2012-05-03 ENCOUNTER — Ambulatory Visit (HOSPITAL_BASED_OUTPATIENT_CLINIC_OR_DEPARTMENT_OTHER)
Admission: RE | Admit: 2012-05-03 | Discharge: 2012-05-03 | Disposition: A | Discharge status: Home / Self Care | Payer: BC Managed Care – PPO | Source: Ambulatory Visit | Attending: Orthopedic Surgery | Admitting: Orthopedic Surgery

## 2012-05-03 ENCOUNTER — Encounter (HOSPITAL_BASED_OUTPATIENT_CLINIC_OR_DEPARTMENT_OTHER)
Admission: RE | Disposition: A | Discharge status: Home / Self Care | Payer: Self-pay | Source: Ambulatory Visit | Attending: Orthopedic Surgery

## 2012-05-03 ENCOUNTER — Other Ambulatory Visit: Payer: Self-pay | Admitting: Orthopedic Surgery

## 2012-05-03 ENCOUNTER — Ambulatory Visit (HOSPITAL_BASED_OUTPATIENT_CLINIC_OR_DEPARTMENT_OTHER): Payer: BC Managed Care – PPO | Admitting: *Deleted

## 2012-05-03 ENCOUNTER — Encounter (HOSPITAL_BASED_OUTPATIENT_CLINIC_OR_DEPARTMENT_OTHER): Payer: Self-pay | Admitting: *Deleted

## 2012-05-03 DIAGNOSIS — M67919 Unspecified disorder of synovium and tendon, unspecified shoulder: Secondary | ICD-10-CM | POA: Insufficient documentation

## 2012-05-03 DIAGNOSIS — I10 Essential (primary) hypertension: Secondary | ICD-10-CM | POA: Insufficient documentation

## 2012-05-03 DIAGNOSIS — K5730 Diverticulosis of large intestine without perforation or abscess without bleeding: Secondary | ICD-10-CM | POA: Insufficient documentation

## 2012-05-03 DIAGNOSIS — M719 Bursopathy, unspecified: Secondary | ICD-10-CM | POA: Insufficient documentation

## 2012-05-03 DIAGNOSIS — M249 Joint derangement, unspecified: Secondary | ICD-10-CM | POA: Insufficient documentation

## 2012-05-03 DIAGNOSIS — M19012 Primary osteoarthritis, left shoulder: Secondary | ICD-10-CM

## 2012-05-03 HISTORY — PX: SHOULDER ARTHROSCOPY: SHX128

## 2012-05-03 HISTORY — DX: Primary osteoarthritis, left shoulder: M19.012

## 2012-05-03 HISTORY — DX: Other specified health status: Z78.9

## 2012-05-03 SURGERY — ARTHROSCOPY, SHOULDER
Anesthesia: General | Site: Shoulder | Laterality: Left | Wound class: Clean

## 2012-05-03 MED ORDER — PROPOFOL 10 MG/ML IV BOLUS
INTRAVENOUS | Status: DC | PRN
  Administered 2012-05-03: 200 mg via INTRAVENOUS

## 2012-05-03 MED ORDER — LIDOCAINE HCL (CARDIAC) 20 MG/ML IV SOLN
INTRAVENOUS | Status: DC | PRN
  Administered 2012-05-03: 50 mg via INTRAVENOUS

## 2012-05-03 MED ORDER — SUCCINYLCHOLINE CHLORIDE 20 MG/ML IJ SOLN
INTRAMUSCULAR | Status: DC | PRN
  Administered 2012-05-03: 100 mg via INTRAVENOUS

## 2012-05-03 MED ORDER — LACTATED RINGERS IV SOLN
INTRAVENOUS | Status: DC
  Administered 2012-05-03: 12:00:00 via INTRAVENOUS

## 2012-05-03 MED ORDER — CEFAZOLIN SODIUM-DEXTROSE 2-3 GM-% IV SOLR
2.0000 g | INTRAVENOUS | Status: AC
  Administered 2012-05-03: 2 g via INTRAVENOUS

## 2012-05-03 MED ORDER — MEPERIDINE HCL 25 MG/ML IJ SOLN: 6.2500 mg | INTRAMUSCULAR | Status: DC | PRN

## 2012-05-03 MED ORDER — FENTANYL CITRATE 0.05 MG/ML IJ SOLN
100.0000 ug | INTRAMUSCULAR | Status: DC | PRN
  Administered 2012-05-03: 100 ug via INTRAVENOUS

## 2012-05-03 MED ORDER — SODIUM CHLORIDE 0.9 % IR SOLN
Status: DC | PRN
  Administered 2012-05-03: 6000 mL

## 2012-05-03 MED ORDER — DEXAMETHASONE SODIUM PHOSPHATE 4 MG/ML IJ SOLN
INTRAMUSCULAR | Status: DC | PRN
  Administered 2012-05-03: 10 mg via INTRAVENOUS

## 2012-05-03 MED ORDER — OXYCODONE-ACETAMINOPHEN 10-325 MG PO TABS: 1.0000 | ORAL_TABLET | Freq: Four times a day (QID) | ORAL | Status: DC | PRN

## 2012-05-03 MED ORDER — ONDANSETRON HCL 4 MG/2ML IJ SOLN: 4.0000 mg | Freq: Once | INTRAMUSCULAR | Status: DC | PRN

## 2012-05-03 MED ORDER — OXYCODONE HCL 5 MG/5ML PO SOLN: 5.0000 mg | Freq: Once | ORAL | Status: DC | PRN

## 2012-05-03 MED ORDER — PROMETHAZINE HCL 25 MG PO TABS: 25.0000 mg | ORAL_TABLET | Freq: Four times a day (QID) | ORAL | Status: DC | PRN

## 2012-05-03 MED ORDER — METHOCARBAMOL 500 MG PO TABS: 500.0000 mg | ORAL_TABLET | Freq: Four times a day (QID) | ORAL | Status: DC

## 2012-05-03 MED ORDER — BUPIVACAINE-EPINEPHRINE PF 0.5-1:200000 % IJ SOLN
INTRAMUSCULAR | Status: DC | PRN
  Administered 2012-05-03: 30 mL

## 2012-05-03 MED ORDER — MIDAZOLAM HCL 2 MG/2ML IJ SOLN
1.0000 mg | INTRAMUSCULAR | Status: DC | PRN
  Administered 2012-05-03: 2 mg via INTRAVENOUS

## 2012-05-03 MED ORDER — HYDROMORPHONE HCL PF 1 MG/ML IJ SOLN
0.2500 mg | INTRAMUSCULAR | Status: DC | PRN
  Administered 2012-05-03 (×2): 0.5 mg via INTRAVENOUS

## 2012-05-03 MED ORDER — OXYCODONE HCL 5 MG PO TABS: 5.0000 mg | ORAL_TABLET | Freq: Once | ORAL | Status: DC | PRN

## 2012-05-03 MED ORDER — ONDANSETRON HCL 4 MG/2ML IJ SOLN
INTRAMUSCULAR | Status: DC | PRN
  Administered 2012-05-03: 4 mg via INTRAVENOUS

## 2012-05-03 SURGICAL SUPPLY — 63 items
APL SKNCLS STERI-STRIP NONHPOA (GAUZE/BANDAGES/DRESSINGS) ×2
BENZOIN TINCTURE PRP APPL 2/3 (GAUZE/BANDAGES/DRESSINGS) ×3 IMPLANT
BLADE CUTTER GATOR 3.5 (BLADE) ×3 IMPLANT
BLADE GREAT WHITE 4.2 (BLADE) IMPLANT
BLADE SURG 15 STRL LF DISP TIS (BLADE) IMPLANT
BLADE SURG 15 STRL SS (BLADE)
BUR OVAL 4.0 (BURR) IMPLANT
BUR OVAL 6.0 (BURR) IMPLANT
CANISTER OMNI JUG 16 LITER (MISCELLANEOUS) ×1 IMPLANT
CANNULA 5.75X71 LONG (CANNULA) ×2 IMPLANT
CANNULA TWIST IN 8.25X7CM (CANNULA) IMPLANT
CANNULA TWIST IN 8.25X9CM (CANNULA) IMPLANT
CLOTH BEACON ORANGE TIMEOUT ST (SAFETY) ×3 IMPLANT
DECANTER SPIKE VIAL GLASS SM (MISCELLANEOUS) IMPLANT
DRAPE INCISE IOBAN 66X45 STRL (DRAPES) ×3 IMPLANT
DRAPE SHOULDER BEACH CHAIR (DRAPES) ×3 IMPLANT
DRAPE U 20/CS (DRAPES) ×3 IMPLANT
DRAPE U-SHAPE 47X51 STRL (DRAPES) ×3 IMPLANT
DRSG PAD ABDOMINAL 8X10 ST (GAUZE/BANDAGES/DRESSINGS) ×3 IMPLANT
DURAPREP 26ML APPLICATOR (WOUND CARE) ×3 IMPLANT
ELECT REM PT RETURN 9FT ADLT (ELECTROSURGICAL)
ELECTRODE REM PT RTRN 9FT ADLT (ELECTROSURGICAL) IMPLANT
FIBERSTICK 2 (SUTURE) IMPLANT
GLOVE BIO SURGEON STRL SZ 6.5 (GLOVE) ×4 IMPLANT
GLOVE BIO SURGEON STRL SZ8 (GLOVE) ×3 IMPLANT
GLOVE BIOGEL PI IND STRL 7.0 (GLOVE) ×1 IMPLANT
GLOVE BIOGEL PI IND STRL 8 (GLOVE) ×4 IMPLANT
GLOVE BIOGEL PI INDICATOR 7.0 (GLOVE) ×1
GLOVE BIOGEL PI INDICATOR 8 (GLOVE) ×2
GLOVE ORTHO TXT STRL SZ7.5 (GLOVE) ×3 IMPLANT
GOWN BRE IMP PREV XXLGXLNG (GOWN DISPOSABLE) ×6 IMPLANT
IMMOBILIZER SHOULDER XLGE (ORTHOPEDIC SUPPLIES) IMPLANT
KIT SHOULDER TRACTION (DRAPES) ×3 IMPLANT
LASSO SUT 90 DEGREE (SUTURE) IMPLANT
PACK ARTHROSCOPY DSU (CUSTOM PROCEDURE TRAY) ×3 IMPLANT
PACK BASIN DAY SURGERY FS (CUSTOM PROCEDURE TRAY) ×3 IMPLANT
SET ARTHROSCOPY TUBING (MISCELLANEOUS) ×3
SET ARTHROSCOPY TUBING LN (MISCELLANEOUS) ×2 IMPLANT
SHEET MEDIUM DRAPE 40X70 STRL (DRAPES) ×3 IMPLANT
SLEEVE SCD COMPRESS KNEE MED (MISCELLANEOUS) ×3 IMPLANT
SLING ARM FOAM STRAP LRG (SOFTGOODS) IMPLANT
SLING ARM FOAM STRAP MED (SOFTGOODS) IMPLANT
SLING ARM FOAM STRAP XLG (SOFTGOODS) ×2 IMPLANT
SLING ARM IMMOBILIZER LRG (SOFTGOODS) IMPLANT
SLING ARM IMMOBILIZER MED (SOFTGOODS) IMPLANT
SPONGE GAUZE 4X4 12PLY (GAUZE/BANDAGES/DRESSINGS) ×3 IMPLANT
STRIP CLOSURE SKIN 1/2X4 (GAUZE/BANDAGES/DRESSINGS) ×3 IMPLANT
SUT FIBERWIRE #2 38 T-5 BLUE (SUTURE)
SUT LASSO 45 DEGREE (SUTURE) IMPLANT
SUT LASSO 45 DEGREE LEFT (SUTURE) IMPLANT
SUT LASSO 45D RIGHT (SUTURE) IMPLANT
SUT MNCRL AB 4-0 PS2 18 (SUTURE) ×2 IMPLANT
SUT PDS AB 1 CT  36 (SUTURE)
SUT PDS AB 1 CT 36 (SUTURE) IMPLANT
SUT TIGER TAPE 7 IN WHITE (SUTURE) IMPLANT
SUT VIC AB 3-0 SH 27 (SUTURE)
SUT VIC AB 3-0 SH 27X BRD (SUTURE) IMPLANT
SUTURE FIBERWR #2 38 T-5 BLUE (SUTURE) IMPLANT
TOWEL OR 17X24 6PK STRL BLUE (TOWEL DISPOSABLE) ×3 IMPLANT
TOWEL OR NON WOVEN STRL DISP B (DISPOSABLE) ×4 IMPLANT
TUBE CONNECTING 20X1/4 (TUBING) IMPLANT
WAND STAR VAC 90 (SURGICAL WAND) ×3 IMPLANT
WATER STERILE IRR 1000ML POUR (IV SOLUTION) ×3 IMPLANT

## 2012-05-03 NOTE — Anesthesia Procedure Notes (Addendum)
Anesthesia Regional Block:  Interscalene brachial plexus block  Pre-Anesthetic Checklist: ,, timeout performed, Correct Patient, Correct Site, Correct Laterality, Correct Procedure, Correct Position, site marked, Risks and benefits discussed,  Surgical consent,  Pre-op evaluation,  At surgeon's request and post-op pain management  Laterality: Left  Prep: chloraprep       Needles:  Injection technique: Single-shot  Needle Type: Echogenic Stimulator Needle     Needle Length: 5cm 5 cm     Additional Needles:  Procedures: ultrasound guided (picture in chart) and nerve stimulator Interscalene brachial plexus block  Nerve Stimulator or Paresthesia:  Response: 0.4 mA,   Additional Responses:   Narrative:  Start time: 05/03/2012 12:38 PM End time: 05/03/2012 12:46 PM Injection made incrementally with aspirations every 5 mL.  Performed by: Personally  Anesthesiologist: Arta Bruce MD  Additional Notes: Monitors applied. Patient sedated. Sterile prep and drape,hand hygiene and sterile gloves were used. Relevant anatomy identified.Needle position confirmed.Local anesthetic injected incrementally after negative aspiration. Local anesthetic spread visualized around nerve(s). Vascular puncture avoided. No complications. Image printed for medical record.The patient tolerated the procedure well.       Interscalene brachial plexus block Procedure Name: Intubation Performed by: York Grice Pre-anesthesia Checklist: Patient identified, Timeout performed, Emergency Drugs available, Suction available and Patient being monitored Patient Re-evaluated:Patient Re-evaluated prior to inductionOxygen Delivery Method: Circle system utilized Preoxygenation: Pre-oxygenation with 100% oxygen Intubation Type: IV induction Ventilation: Mask ventilation without difficulty Grade View: Grade I Tube type: Oral Tube size: 8.0 mm Number of attempts: 1 Airway Equipment and Method: Stylet Placement  Confirmation: ETT inserted through vocal cords under direct vision,  breath sounds checked- equal and bilateral and positive ETCO2 Secured at: 23 cm Tube secured with: Tape Dental Injury: Teeth and Oropharynx as per pre-operative assessment

## 2012-05-03 NOTE — Transfer of Care (Signed)
Immediate Anesthesia Transfer of Care Note  Patient: Mike Watson  Procedure(s) Performed: Procedure(s) (LRB) with comments: ARTHROSCOPY SHOULDER (Left) - Extensive Debridement and Loose Body Removal  Patient Location: PACU  Anesthesia Type:General  Level of Consciousness: awake and alert   Airway & Oxygen Therapy: Patient Spontanous Breathing and Patient connected to face mask oxygen  Post-op Assessment: Report given to PACU RN and Post -op Vital signs reviewed and stable  Post vital signs: Reviewed and stable  Complications: No apparent anesthesia complications

## 2012-05-03 NOTE — Anesthesia Postprocedure Evaluation (Signed)
Anesthesia Post Note  Patient: Mike Watson  Procedure(s) Performed: Procedure(s) (LRB): ARTHROSCOPY SHOULDER (Left)  Anesthesia type: general  Patient location: PACU  Post pain: Pain level controlled  Post assessment: Patient's Cardiovascular Status Stable  Last Vitals:  Filed Vitals:   05/03/12 1545  BP: 161/87  Pulse: 81  Temp:   Resp: 15    Post vital signs: Reviewed and stable  Level of consciousness: sedated  Complications: No apparent anesthesia complications

## 2012-05-03 NOTE — Progress Notes (Signed)
AssistedDr. Ossey with left, ultrasound guided, interscalene  block. Side rails up, monitors on throughout procedure. See vital signs in flow sheet. Tolerated Procedure well.  

## 2012-05-03 NOTE — Op Note (Signed)
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  PROCEDURE:  ARTHROSCOPY SHOULDER  SURGEON:  Eulas Post, MD  PHYSICIAN ASSISTANT: Janace Litten, OPA-C, present and scrubbed throughout the case, critical for completion in a timely fashion, and for retraction, instrumentation, and closure.  ANESTHESIA:   General  PREOPERATIVE INDICATIONS:  [name] is a  51 y.o. male with a diagnosis of left shoulder pain, with loose chondral fragments seen on MRI who failed conservative measures and elected for surgical management.    The risks benefits and alternatives were discussed with the patient preoperatively including but not limited to the risks of infection, bleeding, nerve injury, cardiopulmonary complications, the need for revision surgery, among others, and the patient was willing to proceed.  OPERATIVE IMPLANTS: None  OPERATIVE FINDINGS: Extensive grade 3 chondral loss of the humeral head, primarily inferiorly. The labrum was reasonably well attached to the glenoid, both posteriorly, superiorly, and anteriorly. The glenoid had a small area with a grade 3 chondral injury, however the majority of the glenoid was intact. The humeral head however had marked chondral loss and normal loose bodies floating within the joint.  OPERATIVE PROCEDURE: The patient is brought to the operating room placed in the supine position. General anesthesia was administered. IV antibiotics were given. The left upper extremity was examined, and had some crepitance, but did have full motion. The shoulder grossly felt stable, and I could not dislocate him either anteriorly or posteriorly, although he did have a grinding feeling.  Left upper extremity was  prepped and draped in usual sterile fashion. Diagnostic arthroscopy was carried out after a timeout. The above-named findings were noted. I used the arthroscopic shaver to debride the loose chondral fragments, and remove some of them with a grasper. I also debrided the chondral fragment on the glenoid. I debrided the labrum, and closely evaluate the rotator cuff. The subscapularis and supraspinatus were all completely normal, as was the biceps tendon.  After complete debridement was achieved, I also viewed from anteriorly, and confirmed integrity of the posterior labrum.  After all loose chondral fragments were removed, and the head was debrided adequately, I then went to the subacromial space.  The CA ligament was completely normal, with no evidence for rotator cuff tear from above, and no significant bursitis.  Instruments were then removed, and the portals closed with Monocryl followed by Steri-Strips and sterile gauze. He was awakened and returned to the PACU in stable and satisfactory condition. There were no complications.

## 2012-05-03 NOTE — Anesthesia Preprocedure Evaluation (Addendum)
Anesthesia Evaluation  Patient identified by MRN, date of birth, ID band Patient awake    Reviewed: Allergy & Precautions, H&P , NPO status , Patient's Chart, lab work & pertinent test results  Airway Mallampati: I TM Distance: >3 FB Neck ROM: Full    Dental   Pulmonary          Cardiovascular hypertension, Pt. on medications     Neuro/Psych    GI/Hepatic   Endo/Other    Renal/GU      Musculoskeletal   Abdominal   Peds  Hematology   Anesthesia Other Findings   Reproductive/Obstetrics                           Anesthesia Physical Anesthesia Plan  ASA: II  Anesthesia Plan: General   Post-op Pain Management:    Induction: Intravenous  Airway Management Planned: Oral ETT  Additional Equipment:   Intra-op Plan:   Post-operative Plan: Extubation in OR  Informed Consent:   Plan Discussed with: CRNA and Surgeon  Anesthesia Plan Comments:         Anesthesia Quick Evaluation

## 2012-05-03 NOTE — H&P (Signed)
PREOPERATIVE H&P  Chief Complaint: LEFT SHOULDER TEAR SLAP LESION, DISORDER ARTICULAR CARTILAGE-SHOULDER, DISORDERS OF BURSAE AND TENDONS IN SHOULDER REGION  HPI: Mike Watson is a 51 y.o. male who presents for preoperative history and physical with a diagnosis of LEFT SHOULDER TEAR SLAP LESION, DISORDER ARTICULAR CARTILAGE-SHOULDER, DISORDERS OF BURSAE AND TENDONS IN SHOULDER REGION. Symptoms are rated as moderate to severe, and have been worsening.  This is significantly impairing activities of daily living.  He has elected for surgical management. This began after an incident while playing basketball and felt like he had a "dead arm". He had therapy as well as injections, but continues to have severe pain, particularly at night.    Past Medical History  Diagnosis Date  . Allergic rhinitis   . Diverticulitis   . Shoulder bursitis   . Rotator cuff syndrome     sees Dr. Enid Baas  . No pertinent past medical history    Past Surgical History  Procedure Date  . Large sebaceous cyst removed from left cheek 08/2009    per Dr. Aurelio Jew  . Colonoscopy    History   Social History  . Marital Status: Married    Spouse Name: N/A    Number of Children: N/A  . Years of Education: N/A   Social History Main Topics  . Smoking status: Never Smoker   . Smokeless tobacco: Never Used  . Alcohol Use: Yes     Comment: occ  . Drug Use: No  . Sexually Active: Not on file   Other Topics Concern  . Not on file   Social History Narrative  . No narrative on file   Family History  Problem Relation Age of Onset  . Paget's disease of bone Father   . Cancer Other     breast in 1st degree relative  . Hypertension Other    No Known Allergies Prior to Admission medications   Medication Sig Start Date End Date Taking? Authorizing Provider  HYDROcodone-acetaminophen (NORCO/VICODIN) 5-325 MG per tablet Take 2 tablets by mouth every 8 (eight) hours as needed for pain. 03/25/12   Nestor Ramp, MD   traMADol (ULTRAM) 50 MG tablet Take 1 tablet (50 mg total) by mouth every 8 (eight) hours as needed for pain. 03/22/12   Judi Saa, DO     Positive ROS: All other systems have been reviewed and were otherwise negative with the exception of those mentioned in the HPI and as above.  Physical Exam: General: Alert, no acute distress Cardiovascular: No pedal edema Respiratory: No cyanosis, no use of accessory musculature GI: No organomegaly, abdomen is soft and non-tender Skin: No lesions in the area of chief complaint Neurologic: Sensation intact distally Psychiatric: Patient is competent for consent with normal mood and affect Lymphatic: No axillary or cervical lymphadenopathy  MUSCULOSKELETAL: Left shoulder has range of motion 0 160, abduction to 80. No pain over the a.c. joint. Positive crossarm abduction.  MRI demonstrates evidence for posterior labral tear with a loose chondral fragment.  Assessment: LEFT SHOULDER TEAR SLAP LESION, DISORDER ARTICULAR CARTILAGE-SHOULDER, DISORDERS OF BURSAE AND TENDONS IN SHOULDER REGION  Plan: Plan for Procedure(s): SHOULDER ARTHROSCOPY WITH POSSIBLE POSTERIOR BANKHART REPAIR  The risks benefits and alternatives were discussed with the patient including but not limited to the risks of nonoperative treatment, versus surgical intervention including infection, bleeding, nerve injury,  blood clots, cardiopulmonary complications, morbidity, mortality, among others, and they were willing to proceed. We've also discussed the risks for incomplete relief of  symptoms, recurrent instability of the shoulder, posttraumatic arthritis, among others.  Posie Lillibridge P, MD Cell [phone] Pager [phone]  05/03/2012 7:28 AM

## 2012-05-07 ENCOUNTER — Encounter (HOSPITAL_BASED_OUTPATIENT_CLINIC_OR_DEPARTMENT_OTHER): Payer: Self-pay | Admitting: Orthopedic Surgery

## 2012-05-15 ENCOUNTER — Telehealth: Payer: Self-pay | Admitting: Family Medicine

## 2012-05-15 NOTE — Telephone Encounter (Signed)
I left voice message with the below information. 

## 2012-05-15 NOTE — Telephone Encounter (Signed)
Patient Information:  Caller Name: Chayce  Phone: [phone]  Patient: Mike Watson, Mike Watson  Gender: Male  DOB: 1960-07-02  Age: 51 Years  PCP: Gershon Crane [facility])   Symptoms  Reason For Call & Symptoms: Issues with ED noted about 3-4 months ago.  Has been having performance issues with erections  Reviewed Health History : Yes  Reviewed Medications: Yes  Reviewed Allergies: Yes  Date of Onset of Symptoms: Unknown  Guideline(s) Used:  Penis and Scrotum Symptoms  Disposition Per Guideline:   See Today or Tomorrow in Office  Reason For Disposition Reached:   ALL other penis - scrotum symptoms (Exception: painless rash < 24 hours duration)  Advice Given:  N/A  Office Follow Up:  Does the office need to follow up with this patient?: Yes  Instructions For The Office: Please follow up with patient regarding erectile dysfunction.  Patient is asking for a prescription to be called in for him, but if he needs evaluation he is willing to come in-last appointment with Dr. Clent Ridges was 01/31/2011  Patient Refused Recommendation:  Patient Requests Prescription  Patient is requesting medication but if he needs to be evaluated first he is willing to do so.  RN Note:  Patient declines appointment at this time, but would rather see if Dr. Clent Ridges would be willing to call something in for patient. Drug store of choice is: [facility] at [phone]

## 2012-05-15 NOTE — Telephone Encounter (Signed)
He would need an OV for this  

## 2012-05-20 ENCOUNTER — Encounter: Payer: Self-pay | Admitting: Family Medicine

## 2012-05-20 ENCOUNTER — Ambulatory Visit (INDEPENDENT_AMBULATORY_CARE_PROVIDER_SITE_OTHER): Payer: BC Managed Care – PPO | Admitting: Family Medicine

## 2012-05-20 VITALS — BP 154/102 | HR 79 | Temp 98.2°F | Wt 242.0 lb

## 2012-05-20 DIAGNOSIS — N529 Male erectile dysfunction, unspecified: Secondary | ICD-10-CM

## 2012-05-20 LAB — TESTOSTERONE: Testosterone: 438.52 ng/dL (ref 350.00–890.00)

## 2012-05-20 LAB — CBC WITH DIFFERENTIAL/PLATELET
Basophils Relative: 0.4 % (ref 0.0–3.0)
Eosinophils Absolute: 0 10*3/uL (ref 0.0–0.7)
Lymphocytes Relative: 19.3 % (ref 12.0–46.0)
MCHC: 33.3 g/dL (ref 30.0–36.0)
Neutrophils Relative %: 72.9 % (ref 43.0–77.0)
Platelets: 281 10*3/uL (ref 150.0–400.0)
RBC: 4.93 Mil/uL (ref 4.22–5.81)
WBC: 6.7 10*3/uL (ref 4.5–10.5)

## 2012-05-20 LAB — PSA: PSA: 1.78 ng/mL (ref 0.10–4.00)

## 2012-05-20 LAB — BASIC METABOLIC PANEL
Calcium: 9.9 mg/dL (ref 8.4–10.5)
GFR: 82.35 mL/min (ref >60.00)
Potassium: 4.4 mEq/L (ref 3.5–5.1)
Sodium: 137 mEq/L (ref 135–145)

## 2012-05-20 MED ORDER — TADALAFIL 20 MG PO TABS: 20.0000 mg | ORAL_TABLET | ORAL | Status: DC | PRN

## 2012-05-20 NOTE — Progress Notes (Signed)
  Subjective:    Patient ID: Mike Watson, male    DOB: [birth year], 51 y.o.   MRN: [id number]  HPI Here for 3 months of erection problems. His desire is fine but he has trouble achieving and maintaining erections. No recent medication changes. He feels well in general. He does admit to a lot of stress this year with work and financial concerns.    Review of Systems  Constitutional: Negative.   Genitourinary: Positive for frequency. Negative for dysuria, urgency, hematuria and difficulty urinating.  Psychiatric/Behavioral: Negative for confusion, dysphoric mood, decreased concentration and agitation. The patient is nervous/anxious.        Objective:   Physical Exam  Constitutional: He appears well-developed and well-nourished.  Cardiovascular: Normal rate, regular rhythm, normal heart sounds and intact distal pulses.   Pulmonary/Chest: Effort normal and breath sounds normal.  Genitourinary: Rectum normal and penis normal. No penile tenderness.       The prostate is mildly enlarged but smooth and not tender          Assessment & Plan:  He has some ED. We will check some labs today. Try samples for Cialis.

## 2012-05-21 NOTE — Progress Notes (Signed)
Quick Note:  I tried to reach pt by phone, no answer. ______ 

## 2012-05-29 ENCOUNTER — Telehealth: Payer: Self-pay | Admitting: Family Medicine

## 2012-05-29 MED ORDER — TADALAFIL 20 MG PO TABS: 20.0000 mg | ORAL_TABLET | ORAL | Status: DC | PRN

## 2012-05-29 NOTE — Telephone Encounter (Signed)
I spoke with pt and gave results. He said that once the results came back and if they were normal, then you were going to prescribe Cialis.

## 2012-05-29 NOTE — Telephone Encounter (Signed)
I sent script and left a voice message for pt.

## 2012-05-29 NOTE — Telephone Encounter (Signed)
Pt would like results of blood work done 11/25. Thank you

## 2012-05-29 NOTE — Telephone Encounter (Signed)
Yes. Please call in Cialis 20 mg to use prn, #10 with 11 rf

## 2012-08-10 ENCOUNTER — Other Ambulatory Visit: Payer: Self-pay

## 2013-05-01 ENCOUNTER — Other Ambulatory Visit: Payer: Self-pay

## 2013-08-08 ENCOUNTER — Other Ambulatory Visit: Payer: Self-pay | Admitting: Family Medicine

## 2013-08-08 ENCOUNTER — Telehealth: Payer: Self-pay | Admitting: Family Medicine

## 2013-08-08 MED ORDER — TADALAFIL 20 MG PO TABS: ORAL_TABLET | ORAL | Status: DC

## 2013-08-08 NOTE — Telephone Encounter (Addendum)
Pt would like refill of CIALIS 20 MG tablet  [facility] [address]  Pt will come in fasting at 9:30 am mon for his cpe, and tb test and says thank you very much!

## 2013-08-08 NOTE — Telephone Encounter (Signed)
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 Pt needs this done asap. Hasn't been seen since 2013 personal issue and befpore that 2012. Pt probably needs cpe in order to fill out forms.  Is it ok to work in?

## 2013-08-08 NOTE — Telephone Encounter (Signed)
Yes we can work him in, thanks

## 2013-08-11 ENCOUNTER — Encounter: Payer: Self-pay | Admitting: Family Medicine

## 2013-08-11 ENCOUNTER — Ambulatory Visit (INDEPENDENT_AMBULATORY_CARE_PROVIDER_SITE_OTHER): Payer: 59 | Admitting: Family Medicine

## 2013-08-11 VITALS — BP 138/90 | HR 70 | Temp 98.3°F | Ht 76.0 in | Wt 240.0 lb

## 2013-08-11 DIAGNOSIS — Z0000 Encounter for general adult medical examination without abnormal findings: Secondary | ICD-10-CM

## 2013-08-11 DIAGNOSIS — Z111 Encounter for screening for respiratory tuberculosis: Secondary | ICD-10-CM

## 2013-08-11 LAB — CBC WITH DIFFERENTIAL/PLATELET
BASOS PCT: 0.3 % (ref 0.0–3.0)
Basophils Absolute: 0 10*3/uL (ref 0.0–0.1)
EOS PCT: 1.5 % (ref 0.0–5.0)
Eosinophils Absolute: 0.1 10*3/uL (ref 0.0–0.7)
HEMATOCRIT: 43.4 % (ref 39.0–52.0)
Hemoglobin: 14.5 g/dL (ref 13.0–17.0)
LYMPHS ABS: 1.2 10*3/uL (ref 0.7–4.0)
Lymphocytes Relative: 25.3 % (ref 12.0–46.0)
MCHC: 33.3 g/dL (ref 30.0–36.0)
MCV: 94.8 fl (ref 78.0–100.0)
MONO ABS: 0.5 10*3/uL (ref 0.1–1.0)
Monocytes Relative: 10.3 % (ref 3.0–12.0)
NEUTROS PCT: 62.6 % (ref 43.0–77.0)
Neutro Abs: 3.1 10*3/uL (ref 1.4–7.7)
Platelets: 274 10*3/uL (ref 150.0–400.0)
RBC: 4.58 Mil/uL (ref 4.22–5.81)
RDW: 13.1 % (ref 11.5–14.6)
WBC: 4.9 10*3/uL (ref 4.5–10.5)

## 2013-08-11 LAB — POCT URINALYSIS DIPSTICK
BILIRUBIN UA: NEGATIVE
GLUCOSE UA: NEGATIVE
KETONES UA: NEGATIVE
Leukocytes, UA: NEGATIVE
Nitrite, UA: NEGATIVE
Protein, UA: NEGATIVE
RBC UA: NEGATIVE
SPEC GRAV UA: 1.02
Urobilinogen, UA: 0.2
pH, UA: 6.5

## 2013-08-11 LAB — LDL CHOLESTEROL, DIRECT: Direct LDL: 153.2 mg/dL

## 2013-08-11 LAB — BASIC METABOLIC PANEL
BUN: 18 mg/dL (ref 6–23)
CO2: 29 mEq/L (ref 19–32)
Calcium: 9.2 mg/dL (ref 8.4–10.5)
Chloride: 101 mEq/L (ref 96–112)
Creatinine, Ser: 0.9 mg/dL (ref 0.4–1.5)
GFR: 93.62 mL/min (ref >60.00)
Glucose, Bld: 78 mg/dL (ref 70–99)
POTASSIUM: 4.2 meq/L (ref 3.5–5.1)
SODIUM: 136 meq/L (ref 135–145)

## 2013-08-11 LAB — HEPATIC FUNCTION PANEL
ALK PHOS: 65 U/L (ref 39–117)
ALT: 20 U/L (ref 0–53)
AST: 25 U/L (ref 0–37)
Albumin: 4.1 g/dL (ref 3.5–5.2)
BILIRUBIN DIRECT: 0.2 mg/dL (ref 0.0–0.3)
Total Bilirubin: 1 mg/dL (ref 0.3–1.2)
Total Protein: 7 g/dL (ref 6.0–8.3)

## 2013-08-11 LAB — LIPID PANEL
Cholesterol: 213 mg/dL — ABNORMAL HIGH (ref 0–200)
HDL: 50.3 mg/dL (ref >39.00)
Total CHOL/HDL Ratio: 4
Triglycerides: 95 mg/dL (ref 0.0–149.0)
VLDL: 19 mg/dL (ref 0.0–40.0)

## 2013-08-11 LAB — PSA: PSA: 2.67 ng/mL (ref 0.10–4.00)

## 2013-08-11 LAB — TSH: TSH: 1.75 u[IU]/mL (ref 0.35–5.50)

## 2013-08-11 MED ORDER — TADALAFIL 20 MG PO TABS: ORAL_TABLET | ORAL | Status: AC

## 2013-08-11 NOTE — Progress Notes (Signed)
   Subjective:    Patient ID: Mike Watson, male    DOB: 11/29/1960, 53 y.o.   MRN: [id number]  HPI 53 yr old male for a cpx. He feels well. One year ago he went on a gluten free diet and he has felt great ever since. He used to have frequent headaches and generalized joint pains, and these have all resolved.    Review of Systems  Constitutional: Negative.   HENT: Negative.   Eyes: Negative.   Respiratory: Negative.   Cardiovascular: Negative.   Gastrointestinal: Negative.   Genitourinary: Negative.   Musculoskeletal: Negative.   Skin: Negative.   Neurological: Negative.   Psychiatric/Behavioral: Negative.        Objective:   Physical Exam  Constitutional: He is oriented to person, place, and time. He appears well-developed and well-nourished. No distress.  HENT:  Head: Normocephalic and atraumatic.  Right Ear: External ear normal.  Left Ear: External ear normal.  Nose: Nose normal.  Mouth/Throat: Oropharynx is clear and moist. No oropharyngeal exudate.  Eyes: Conjunctivae and EOM are normal. Pupils are equal, round, and reactive to light. Right eye exhibits no discharge. Left eye exhibits no discharge. No scleral icterus.  Neck: Neck supple. No JVD present. No tracheal deviation present. No thyromegaly present.  Cardiovascular: Normal rate, regular rhythm, normal heart sounds and intact distal pulses.  Exam reveals no gallop and no friction rub.   No murmur heard. EKG normal   Pulmonary/Chest: Effort normal and breath sounds normal. No respiratory distress. He has no wheezes. He has no rales. He exhibits no tenderness.  Abdominal: Soft. Bowel sounds are normal. He exhibits no distension and no mass. There is no tenderness. There is no rebound and no guarding.  Genitourinary: Rectum normal, prostate normal and penis normal. Guaiac negative stool. No penile tenderness.  Musculoskeletal: Normal range of motion. He exhibits no edema and no tenderness.  Lymphadenopathy:    He  has no cervical adenopathy.  Neurological: He is alert and oriented to person, place, and time. He has normal reflexes. No cranial nerve deficit. He exhibits normal muscle tone. Coordination normal.  Skin: Skin is warm and dry. No rash noted. He is not diaphoretic. No erythema. No pallor.  Psychiatric: He has a normal mood and affect. His behavior is normal. Judgment and thought content normal.          Assessment & Plan:  Well exam. Get fasting labs

## 2013-08-11 NOTE — Progress Notes (Signed)
Pre visit review using our clinic review tool, if applicable. No additional management support is needed unless otherwise documented below in the visit note. 

## 2013-08-14 LAB — TB SKIN TEST
INDURATION: 0 mm
TB Skin Test: NEGATIVE

## 2014-08-26 IMAGING — RF DG FLUORO GUIDE NDL PLC/BX
3 series · 3 of 3 positions shown · IV contrast (multihance)
Comparison: none

CLINICAL DATA: Left shoulder pain.

LEFT SHOULDER INJECTION UNDER FLUOROSCOPY
TECHNIQUE: An appropriate skin entrance site was determined.  The
site was marked, prepped with Betadine, draped in the usual sterile
fashion, and infiltrated locally with buffered Lidocaine.  22 gauge
spinal needle was advanced to the superomedial margin of the
humeral head under intermittent fluoroscopy.  1 ml of Lidocaine
injected easily.  A mixture of 0.05 ml Multihance 10 ml of
Omnipaque 300 was then used to opacify the left shoulder capsule.
No immediate complication.
Fluoroscopy Time: 0.8 minutes.

[Series 1: run · 1 of 1 slices shown (1 of 3)]
[im 1/1]
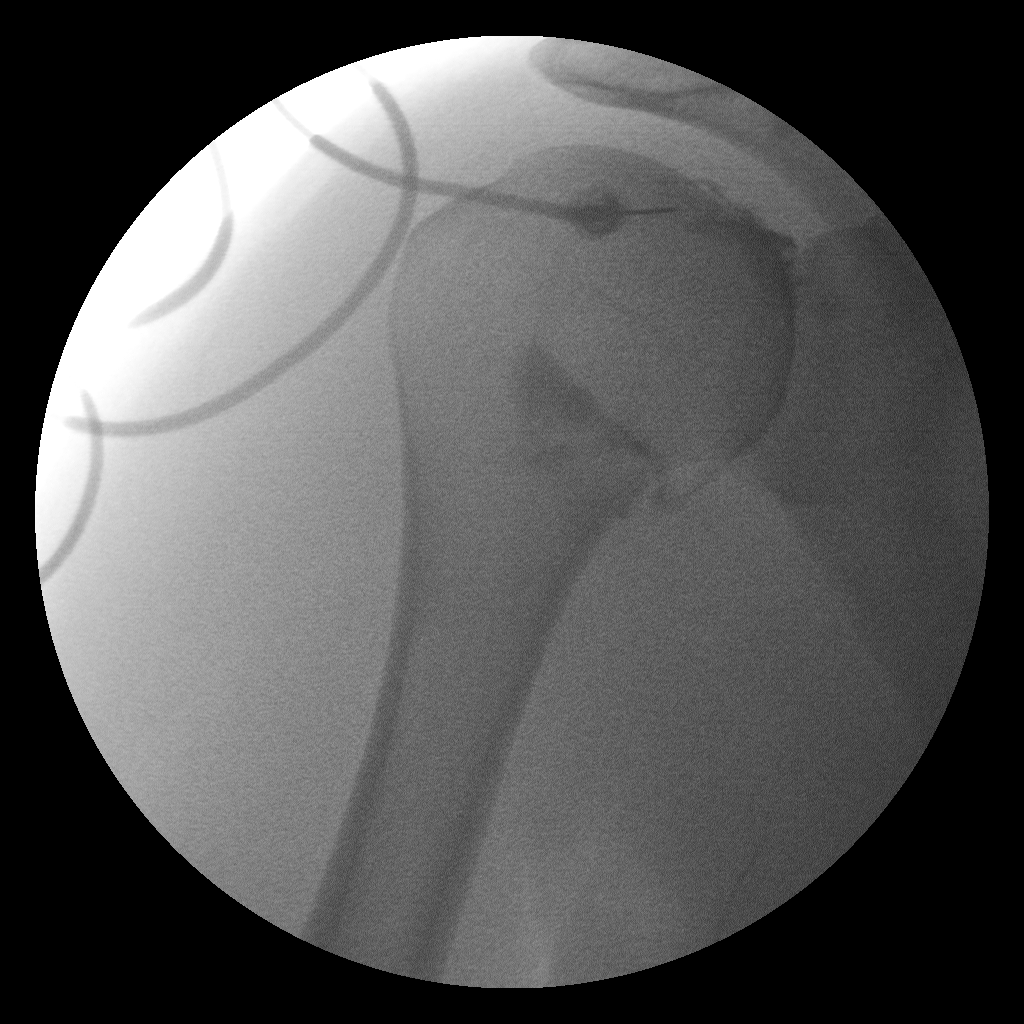

[Series 2: run · 1 of 1 slices shown (2 of 3)]
[im 1/1]
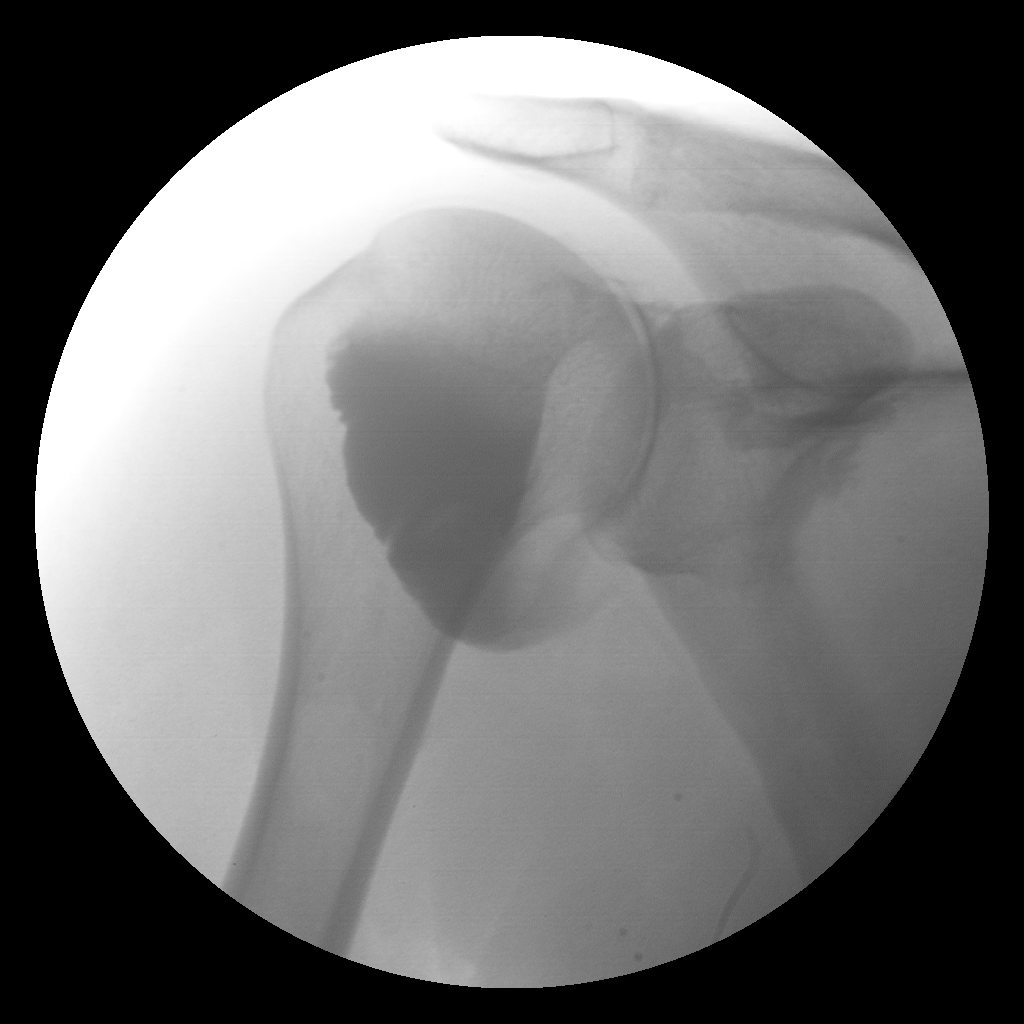

[Series 3: run · 1 of 1 slices shown (3 of 3)]
[im 1/1]
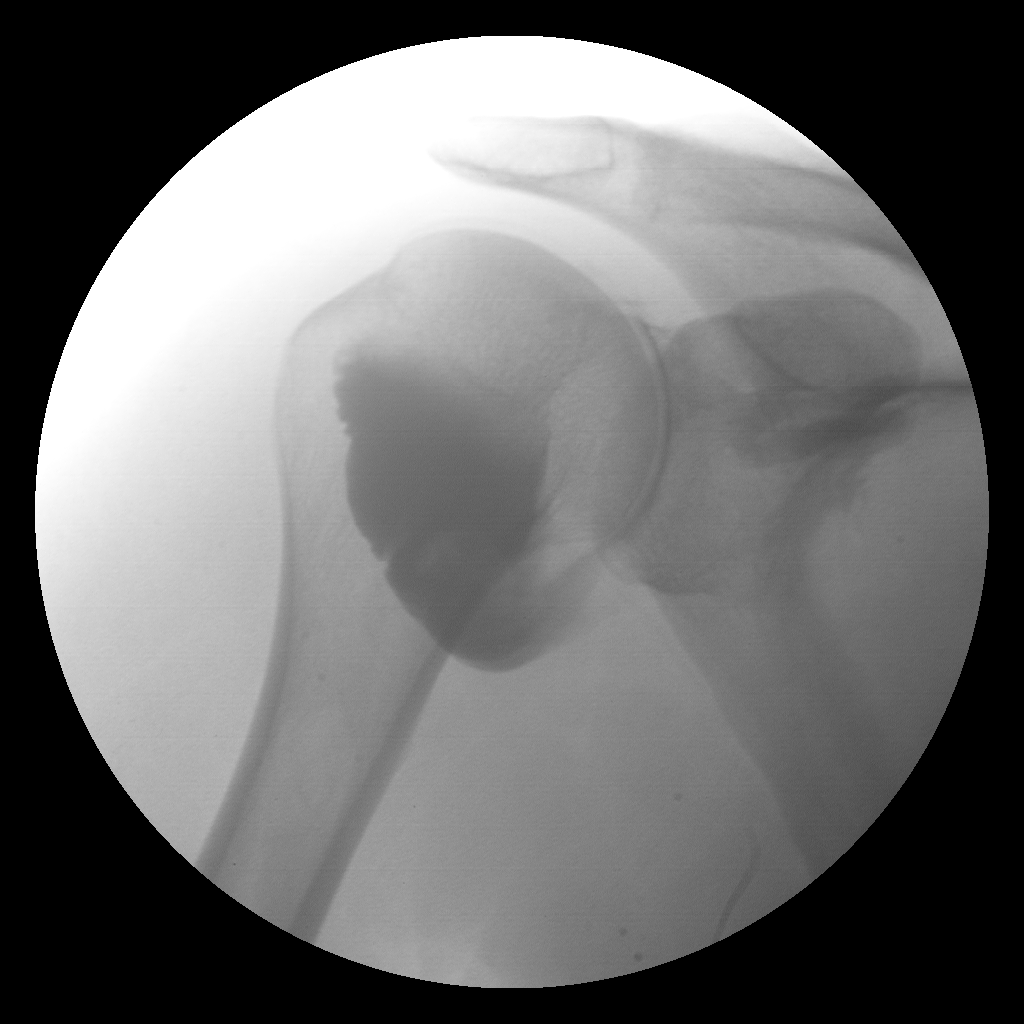

[3 of 3 positions shown; findings below may reference images not displayed]

IMPRESSION: Technically successful left shoulder injection for MRI.

## 2014-10-09 ENCOUNTER — Other Ambulatory Visit: Payer: Self-pay | Admitting: Family Medicine

## 2015-02-12 ENCOUNTER — Encounter: Payer: Self-pay | Admitting: Gastroenterology

## 2015-12-04 ENCOUNTER — Encounter (HOSPITAL_COMMUNITY): Payer: Self-pay | Admitting: *Deleted

## 2015-12-04 ENCOUNTER — Emergency Department (HOSPITAL_COMMUNITY): Payer: No Typology Code available for payment source

## 2015-12-04 ENCOUNTER — Emergency Department (HOSPITAL_COMMUNITY)
Admission: EM | Admit: 2015-12-04 | Discharge: 2015-12-04 | Disposition: A | Discharge status: Home / Self Care | Payer: No Typology Code available for payment source | Attending: Emergency Medicine | Admitting: Emergency Medicine

## 2015-12-04 DIAGNOSIS — Y92410 Unspecified street and highway as the place of occurrence of the external cause: Secondary | ICD-10-CM | POA: Insufficient documentation

## 2015-12-04 DIAGNOSIS — Y999 Unspecified external cause status: Secondary | ICD-10-CM | POA: Insufficient documentation

## 2015-12-04 DIAGNOSIS — R079 Chest pain, unspecified: Secondary | ICD-10-CM | POA: Insufficient documentation

## 2015-12-04 DIAGNOSIS — R51 Headache: Secondary | ICD-10-CM | POA: Insufficient documentation

## 2015-12-04 DIAGNOSIS — M542 Cervicalgia: Secondary | ICD-10-CM | POA: Insufficient documentation

## 2015-12-04 DIAGNOSIS — Y939 Activity, unspecified: Secondary | ICD-10-CM | POA: Diagnosis not present

## 2015-12-04 DIAGNOSIS — M25512 Pain in left shoulder: Secondary | ICD-10-CM

## 2015-12-04 DIAGNOSIS — R109 Unspecified abdominal pain: Secondary | ICD-10-CM | POA: Diagnosis not present

## 2015-12-04 LAB — BASIC METABOLIC PANEL WITH GFR
Anion gap: 6 (ref 5–15)
BUN: 18 mg/dL (ref 6–20)
CO2: 24 mmol/L (ref 22–32)
Calcium: 9.2 mg/dL (ref 8.9–10.3)
Chloride: 107 mmol/L (ref 101–111)
Creatinine, Ser: 1.12 mg/dL (ref 0.61–1.24)
GFR calc Af Amer: >60 mL/min
GFR calc non Af Amer: 54 mL/min — ABNORMAL LOW
Glucose, Bld: 102 mg/dL — ABNORMAL HIGH (ref 65–99)
Potassium: 3.5 mmol/L (ref 3.5–5.1)
Sodium: 137 mmol/L (ref 135–145)

## 2015-12-04 LAB — CBC WITH DIFFERENTIAL/PLATELET
Basophils Absolute: 0 K/uL (ref 0.0–0.1)
Basophils Relative: 0 %
Eosinophils Absolute: 0 K/uL (ref 0.0–0.7)
Eosinophils Relative: 1 %
HCT: 41.4 % (ref 39.0–52.0)
Hemoglobin: 14.6 g/dL (ref 13.0–17.0)
Lymphocytes Relative: 23 %
Lymphs Abs: 1.6 K/uL (ref 0.7–4.0)
MCH: 31.3 pg (ref 26.0–34.0)
MCHC: 35.3 g/dL (ref 30.0–36.0)
MCV: 88.8 fL (ref 78.0–100.0)
Monocytes Absolute: 0.4 K/uL (ref 0.1–1.0)
Monocytes Relative: 6 %
Neutro Abs: 4.7 K/uL (ref 1.7–7.7)
Neutrophils Relative %: 70 %
Platelets: 277 K/uL (ref 150–400)
RBC: 4.66 MIL/uL (ref 4.22–5.81)
RDW: 12.9 % (ref 11.5–15.5)
WBC: 6.8 K/uL (ref 4.0–10.5)

## 2015-12-04 LAB — TROPONIN I: Troponin I: <0.03 ng/mL

## 2015-12-04 LAB — TYPE AND SCREEN
ABO/RH(D): B POS
Antibody Screen: NEGATIVE

## 2015-12-04 LAB — ABO/RH: ABO/RH(D): B POS

## 2015-12-04 MED ORDER — OXYCODONE-ACETAMINOPHEN 5-325 MG PO TABS: 1.0000 | ORAL_TABLET | ORAL | Status: AC | PRN

## 2015-12-04 MED ORDER — IOPAMIDOL (ISOVUE-300) INJECTION 61%
INTRAVENOUS | Status: AC
  Filled 2015-12-04: qty 100

## 2015-12-04 MED ORDER — SODIUM CHLORIDE 0.9 % IV BOLUS (SEPSIS)
1000.0000 mL | Freq: Once | INTRAVENOUS | Status: AC
  Administered 2015-12-04: 1000 mL via INTRAVENOUS

## 2015-12-04 MED ORDER — METHOCARBAMOL 500 MG PO TABS: 1000.0000 mg | ORAL_TABLET | Freq: Three times a day (TID) | ORAL | Status: AC | PRN
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
## 2015-12-04 NOTE — Progress Notes (Signed)
   12/04/15 1500  Clinical Encounter Type  Visited With Health care provider  Visit Type ED  Referral From Nurse  Spiritual Encounters  Spiritual Needs Emotional  Stress Factors  Patient Stress Factors Health changes  Chaplain responded to page, patient receiving treatment, provided support to staff. Family in ED, will escort and assist as needed.

## 2015-12-06 ENCOUNTER — Encounter: Payer: Self-pay | Admitting: Family Medicine

## 2015-12-06 MED ORDER — IOPAMIDOL (ISOVUE-300) INJECTION 61%
100.0000 mL | Freq: Once | INTRAVENOUS | Status: AC | PRN
  Administered 2015-12-04: 100 mL via INTRAVENOUS

## 2016-12-23 ENCOUNTER — Other Ambulatory Visit: Payer: Self-pay | Admitting: Family Medicine

## 2016-12-26 ENCOUNTER — Other Ambulatory Visit: Payer: Self-pay | Admitting: Family Medicine

## 2017-03-15 ENCOUNTER — Encounter: Payer: Self-pay | Admitting: Family Medicine
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
